# Patient Record
Sex: Female | Born: 1995 | Race: White | Hispanic: No | Marital: Single | Smoking: Never smoker
Health system: Southern US, Community
[De-identification: ages and names within clinical notes are randomized; demographics above are authoritative.]

## PROBLEM LIST (undated history)

## (undated) DIAGNOSIS — S93303A Unspecified subluxation of unspecified foot, initial encounter: Secondary | ICD-10-CM

## (undated) DIAGNOSIS — M6700 Short Achilles tendon (acquired), unspecified ankle: Secondary | ICD-10-CM

## (undated) DIAGNOSIS — Z8701 Personal history of pneumonia (recurrent): Secondary | ICD-10-CM

## (undated) DIAGNOSIS — Z973 Presence of spectacles and contact lenses: Secondary | ICD-10-CM

## (undated) DIAGNOSIS — L309 Dermatitis, unspecified: Secondary | ICD-10-CM

## (undated) DIAGNOSIS — J45909 Unspecified asthma, uncomplicated: Secondary | ICD-10-CM

## (undated) HISTORY — PX: TONSILLECTOMY AND ADENOIDECTOMY: SUR1326

---

## 2015-09-04 NOTE — H&P (Signed)
Visit Note - Office Visit   Provider: Sherlynn StallsJason Annalina Needles, DPM Encounter Date: Jul 27, 2015 Patient: Bianca Mccullough, Bianca Mccullough    ([id number]) Sex: Female       DOB: Apr 15, 1996      Age: 20 year 3 month 1 week       Race: White Address: [address] ,  [place]  [place]  [id number]    [place] Phone(H): [phone] Primary Dr.: MICHELLE POWELL Insurance(s):  [id number] (PP) Referred By:  Gustavus MessingMICHELLE POWELL  Complaint: Chief Complaint: follow up; from CT.  History of present illness: Nature: aching and sharp. Location: plantar heel, arch , bilaterally. Onset: (Acute vs Insidious): Gradual. Character/Pain Level: 7 on a 10-point scale. Aggravating factors: prolonged standing and work environment. Patient denies diabetes.  Trauma: No history of trauma.  Current Medication: 1. Albuterol Sulfate .083 % Nebu (Other MD)  2. Symbicort 80-4.5 Mcg Inhaler Mcg/actuation (Other MD)  3. Zyrtec (Other MD)   ROS: Integument:  (-) for itching; (-) for psoriasis; (+) for eczema; (-) for hives; (-) for rash; (-) for wounds; (-) for skin cancer. Musculoskeletal:  (-) for arthritis; (-) for stiffness; (-) for low back pain; (-) for bursitis; (-) for gout; (+) for knee pain; (+) for hip pain; (-) morning stiffness; (-) for multiple joint pains. Constitutional: No fever, chills, or nausea.  Medical History: Asthma.  tonsils and adnoids.  Family History/Social History: Arthritis: Grandmother. Cancer: History of cancer in several family members.  Patient denies being currently pregnant.  Smoking History: Patient denies tobacco use. Alcohol Use: Patient denies alcohol use. INSULIN: Patient denies taking insulin. COUMADIN/PLAVIX USE: Patient denies taking coumadin/plavix.  Allergy: Penicillins  Examination: Labs/Imaging Results: Normal CT without significant findings.  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  Established office visit to discuss etiology treatment and prognosis   Diagnosis: Achilles Contracture LEFT; Achilles Contracture RIGHT and Subluxation LEFT Foot; Subluxation RIGHT Foot  EVALUATION & MANAGEMENT: Discussion: Discussed surgical options versus conservative care along with nature of procedure and post op course.  Recommendations:   Prefab AFO. Custom molded orthotics. Surgical Consultation: Discussed surgical versus conservative treatment options. Risks versus benefits were discussed along with the nature of the procedure, post-operative course. Discussed possible complications, not limited to, such as: slow-healing, infection, need for further surgery, chronic pain (RSDS), blood clots, bleeding problems, weakness, chronic swelling of the foot/digits, and arthritis. Rare but serious complications can even result in loss of digit, loss of limb, or loss of life. No guarantees were given. Alternatives to surgery were discussed today.  PLANNED PROCEDURE(S). Left: Subtalar joint implant using conical screw to stabilize subluxation at the talocalcaneal joint. Gastrocnemius Recession. CPT [id number] and [id number].   We will begin planning for scheduling the surgery based on patient's preference.  SUPPLIES:   PNEUMATIC CAM WALKER: Prescribed PNEUMATIC CAM WALKER for the LEFT LOWER EXTREMITY. Indications: (Diagnosis: Subluxation LEFT Foot, reduce pressure at the surgical area by immobilizing flexion and extension at the ankle as well as reducing plantar pressure forces via the rocker bottom outsole) . Patient will make a decision today based on cost and/or precertification. I believe these are necessary based on patient's condition and should help in the long term management of symptoms .

## 2015-09-05 ENCOUNTER — Encounter (HOSPITAL_BASED_OUTPATIENT_CLINIC_OR_DEPARTMENT_OTHER): Payer: Self-pay | Admitting: *Deleted

## 2015-09-05 NOTE — Progress Notes (Addendum)
SPOKE W/ PT'S MOTHER.  NPO AFTER MN.  ARRIVE AT 0945.  NEEDS URINE PREG.  OTHER CURRENT LAB RESULTS IN CHART.  WILL TAKE ZYRTEC / DO SYMBICORT INHALER AM DOS W/ SIPS OF WATER AND BRING RESCUE INHALER.  MOTHER H&P AND CXR DONE 09-04-2015 AND FAXED TO DR Suzette BattiestZEIGLER .  [facility] D[facility]2015.  DUE TO DX OF PNEUMONIA AFTER THIS DATE, ON 08-21-2015,  CALLED AND LEFT MESSAGE FOR LESLIE, OR SCHEDULER FOR DR ZEIGLER, THAT WE NEED CXR RESULT FROM 09-04-2015 AND UPDATE OR ADDENDUM ADDED TO H&P.  RECEIVED CXR RESULT FROM 09-04-2015, PNEUMONIA RESOLVED.

## 2015-09-07 ENCOUNTER — Encounter (HOSPITAL_BASED_OUTPATIENT_CLINIC_OR_DEPARTMENT_OTHER): Payer: Self-pay | Admitting: *Deleted

## 2015-09-07 ENCOUNTER — Ambulatory Visit (HOSPITAL_BASED_OUTPATIENT_CLINIC_OR_DEPARTMENT_OTHER): Payer: 59 | Admitting: Anesthesiology

## 2015-09-07 ENCOUNTER — Encounter (HOSPITAL_BASED_OUTPATIENT_CLINIC_OR_DEPARTMENT_OTHER)
Admission: RE | Disposition: A | Discharge status: Home / Self Care | Payer: Self-pay | Source: Ambulatory Visit | Attending: Podiatry

## 2015-09-07 ENCOUNTER — Ambulatory Visit (HOSPITAL_BASED_OUTPATIENT_CLINIC_OR_DEPARTMENT_OTHER)
Admission: RE | Admit: 2015-09-07 | Discharge: 2015-09-07 | Disposition: A | Discharge status: Home / Self Care | Payer: 59 | Source: Ambulatory Visit | Attending: Podiatry | Admitting: Podiatry

## 2015-09-07 DIAGNOSIS — M24875 Other specific joint derangements left foot, not elsewhere classified: Secondary | ICD-10-CM | POA: Diagnosis not present

## 2015-09-07 DIAGNOSIS — M24874 Other specific joint derangements of right foot, not elsewhere classified: Secondary | ICD-10-CM | POA: Insufficient documentation

## 2015-09-07 DIAGNOSIS — M79672 Pain in left foot: Secondary | ICD-10-CM | POA: Diagnosis present

## 2015-09-07 DIAGNOSIS — M6701 Short Achilles tendon (acquired), right ankle: Secondary | ICD-10-CM | POA: Diagnosis not present

## 2015-09-07 DIAGNOSIS — J45909 Unspecified asthma, uncomplicated: Secondary | ICD-10-CM | POA: Diagnosis not present

## 2015-09-07 DIAGNOSIS — M6702 Short Achilles tendon (acquired), left ankle: Secondary | ICD-10-CM | POA: Diagnosis not present

## 2015-09-07 DIAGNOSIS — M357 Hypermobility syndrome: Secondary | ICD-10-CM

## 2015-09-07 HISTORY — DX: Personal history of pneumonia (recurrent): Z87.01

## 2015-09-07 HISTORY — PX: INSERTION OF CONICAL SUBTALAR IMPLANT (CSI): SHX6453

## 2015-09-07 HISTORY — DX: Dermatitis, unspecified: L30.9

## 2015-09-07 HISTORY — DX: Presence of spectacles and contact lenses: Z97.3

## 2015-09-07 HISTORY — DX: Unspecified asthma, uncomplicated: J45.909

## 2015-09-07 HISTORY — DX: Short Achilles tendon (acquired), unspecified ankle: M67.00

## 2015-09-07 HISTORY — DX: Unspecified subluxation of unspecified foot, initial encounter: S93.303A

## 2015-09-07 HISTORY — PX: GASTROC RECESSION EXTREMITY: SHX6262

## 2015-09-07 LAB — POCT PREGNANCY, URINE: PREG TEST UR: NEGATIVE

## 2015-09-07 SURGERY — RECESSION, TENDON, GASTROCNEMIUS
Anesthesia: General | Site: Foot | Laterality: Left

## 2015-09-07 MED ORDER — HYDROCODONE-ACETAMINOPHEN 5-325 MG PO TABS
ORAL_TABLET | ORAL | Status: AC
  Filled 2015-09-07: qty 1

## 2015-09-07 MED ORDER — ONDANSETRON HCL 4 MG/2ML IJ SOLN
INTRAMUSCULAR | Status: DC | PRN
  Administered 2015-09-07: 4 mg via INTRAVENOUS

## 2015-09-07 MED ORDER — LACTATED RINGERS IV SOLN
INTRAVENOUS | Status: DC
Start: 2015-09-07 — End: 2015-09-07
  Administered 2015-09-07: 11:00:00 via INTRAVENOUS
  Filled 2015-09-07: qty 1000

## 2015-09-07 MED ORDER — MIDAZOLAM HCL 5 MG/5ML IJ SOLN
INTRAMUSCULAR | Status: DC | PRN
  Administered 2015-09-07: 2 mg via INTRAVENOUS

## 2015-09-07 MED ORDER — FENTANYL CITRATE (PF) 100 MCG/2ML IJ SOLN
INTRAMUSCULAR | Status: DC | PRN
  Administered 2015-09-07 (×2): 50 ug via INTRAVENOUS

## 2015-09-07 MED ORDER — CIPROFLOXACIN IN D5W 400 MG/200ML IV SOLN
400.0000 mg | INTRAVENOUS | Status: DC
Start: 2015-09-07 — End: 2015-09-07
  Filled 2015-09-07: qty 200

## 2015-09-07 MED ORDER — FENTANYL CITRATE (PF) 100 MCG/2ML IJ SOLN
INTRAMUSCULAR | Status: AC
  Filled 2015-09-07: qty 2

## 2015-09-07 MED ORDER — PROPOFOL 10 MG/ML IV BOLUS
INTRAVENOUS | Status: DC | PRN
  Administered 2015-09-07: 200 mg via INTRAVENOUS

## 2015-09-07 MED ORDER — DEXAMETHASONE SODIUM PHOSPHATE 4 MG/ML IJ SOLN
INTRAMUSCULAR | Status: DC | PRN
  Administered 2015-09-07: 10 mg via INTRAVENOUS

## 2015-09-07 MED ORDER — ONDANSETRON HCL 4 MG/2ML IJ SOLN
INTRAMUSCULAR | Status: AC
  Filled 2015-09-07: qty 2

## 2015-09-07 MED ORDER — LIDOCAINE HCL (CARDIAC) 20 MG/ML IV SOLN
INTRAVENOUS | Status: AC
  Filled 2015-09-07: qty 5

## 2015-09-07 MED ORDER — SODIUM CHLORIDE 0.9 % IR SOLN
Status: DC | PRN
  Administered 2015-09-07: 500 mL

## 2015-09-07 MED ORDER — CIPROFLOXACIN IN D5W 400 MG/200ML IV SOLN
INTRAVENOUS | Status: DC | PRN
  Administered 2015-09-07: 400 mg via INTRAVENOUS

## 2015-09-07 MED ORDER — MIDAZOLAM HCL 2 MG/2ML IJ SOLN
INTRAMUSCULAR | Status: AC
  Filled 2015-09-07: qty 2

## 2015-09-07 MED ORDER — PROPOFOL 10 MG/ML IV BOLUS
INTRAVENOUS | Status: AC
  Filled 2015-09-07: qty 20

## 2015-09-07 MED ORDER — BUPIVACAINE-EPINEPHRINE 0.5% -1:200000 IJ SOLN
INTRAMUSCULAR | Status: DC | PRN
  Administered 2015-09-07: 17 mL

## 2015-09-07 MED ORDER — LIDOCAINE HCL (CARDIAC) 20 MG/ML IV SOLN
INTRAVENOUS | Status: DC | PRN
  Administered 2015-09-07: 100 mg via INTRAVENOUS

## 2015-09-07 MED ORDER — CIPROFLOXACIN IN D5W 400 MG/200ML IV SOLN
INTRAVENOUS | Status: AC
  Filled 2015-09-07: qty 200

## 2015-09-07 MED ORDER — PROMETHAZINE HCL 25 MG/ML IJ SOLN
6.2500 mg | INTRAMUSCULAR | Status: DC | PRN
  Filled 2015-09-07: qty 1

## 2015-09-07 MED ORDER — FENTANYL CITRATE (PF) 100 MCG/2ML IJ SOLN
25.0000 ug | INTRAMUSCULAR | Status: DC | PRN
  Administered 2015-09-07: 25 ug via INTRAVENOUS
  Filled 2015-09-07: qty 1

## 2015-09-07 MED ORDER — DEXAMETHASONE SODIUM PHOSPHATE 10 MG/ML IJ SOLN
INTRAMUSCULAR | Status: AC
  Filled 2015-09-07: qty 1

## 2015-09-07 MED ORDER — HYDROCODONE-ACETAMINOPHEN 5-325 MG PO TABS
1.0000 | ORAL_TABLET | ORAL | Status: DC | PRN
  Administered 2015-09-07: 1 via ORAL
  Filled 2015-09-07: qty 1

## 2015-09-07 SURGICAL SUPPLY — 50 items
BLADE AVERAGE 25MMX9MM (BLADE)
BLADE AVERAGE 25X9 (BLADE) IMPLANT
BLADE SURG 15 STRL LF DISP TIS (BLADE) ×1 IMPLANT
BLADE SURG 15 STRL SS (BLADE) ×2
BNDG COHESIVE 3X5 TAN STRL LF (GAUZE/BANDAGES/DRESSINGS) ×3 IMPLANT
BNDG CONFORM 3 STRL LF (GAUZE/BANDAGES/DRESSINGS) ×3 IMPLANT
BNDG ESMARK 4X9 LF (GAUZE/BANDAGES/DRESSINGS) ×3 IMPLANT
COVER BACK TABLE 60X90IN (DRAPES) ×3 IMPLANT
CUFF TOURNIQUET SINGLE 24IN (TOURNIQUET CUFF) ×3 IMPLANT
DRAPE EXTREMITY T 121X128X90 (DRAPE) ×3 IMPLANT
DRAPE LG THREE QUARTER DISP (DRAPES) ×3 IMPLANT
DRAPE OEC MINIVIEW 54X84 (DRAPES) ×3 IMPLANT
ELECT REM PT RETURN 9FT ADLT (ELECTROSURGICAL) ×3
ELECTRODE REM PT RTRN 9FT ADLT (ELECTROSURGICAL) ×1 IMPLANT
GAUZE XEROFORM 1X8 LF (GAUZE/BANDAGES/DRESSINGS) ×3 IMPLANT
GLOVE BIOGEL M 6.5 STRL (GLOVE) ×6 IMPLANT
GLOVE BIOGEL PI IND STRL 6.5 (GLOVE) ×2 IMPLANT
GLOVE BIOGEL PI IND STRL 7.5 (GLOVE) ×1 IMPLANT
GLOVE BIOGEL PI INDICATOR 6.5 (GLOVE) ×4
GLOVE BIOGEL PI INDICATOR 7.5 (GLOVE) ×2
GLOVE SURG SS PI 8.0 STRL IVOR (GLOVE) ×3 IMPLANT
GOWN STRL REUS W/ TWL LRG LVL3 (GOWN DISPOSABLE) ×2 IMPLANT
GOWN STRL REUS W/TWL LRG LVL3 (GOWN DISPOSABLE) ×4
GOWN STRL REUS W/TWL XL LVL3 (GOWN DISPOSABLE) ×3 IMPLANT
IMPLANT PRO STOP SUBTALAR 8X14 (Screw) ×3 IMPLANT
KIT ROOM TURNOVER WOR (KITS) ×3 IMPLANT
NEEDLE HYPO 25X1 1.5 SAFETY (NEEDLE) ×3 IMPLANT
NS IRRIG 500ML POUR BTL (IV SOLUTION) ×3 IMPLANT
PACK BASIN DAY SURGERY FS (CUSTOM PROCEDURE TRAY) ×3 IMPLANT
PADDING CAST ABS 3INX4YD NS (CAST SUPPLIES) ×2
PADDING CAST ABS 4INX4YD NS (CAST SUPPLIES) ×2
PADDING CAST ABS COTTON 3X4 (CAST SUPPLIES) ×1 IMPLANT
PADDING CAST ABS COTTON 4X4 ST (CAST SUPPLIES) ×1 IMPLANT
PENCIL BUTTON HOLSTER BLD 10FT (ELECTRODE) ×3 IMPLANT
SPONGE GAUZE 4X4 12PLY (GAUZE/BANDAGES/DRESSINGS) ×3 IMPLANT
SPONGE GAUZE 4X4 12PLY STER LF (GAUZE/BANDAGES/DRESSINGS) ×3 IMPLANT
STOCKINETTE 4X48 STRL (DRAPES) ×3 IMPLANT
SUCTION FRAZIER HANDLE 10FR (MISCELLANEOUS) ×2
SUCTION TUBE FRAZIER 10FR DISP (MISCELLANEOUS) ×1 IMPLANT
SUT ETHILON 4 0 PS 2 18 (SUTURE) ×3 IMPLANT
SUT MNCRL AB 4-0 PS2 18 (SUTURE) IMPLANT
SUT VIC AB 3-0 SH 27 (SUTURE)
SUT VIC AB 3-0 SH 27X BRD (SUTURE) IMPLANT
SUT VICRYL 4-0 PS2 18IN ABS (SUTURE) ×3 IMPLANT
SYR BULB 3OZ (MISCELLANEOUS) ×3 IMPLANT
SYR CONTROL 10ML LL (SYRINGE) ×3 IMPLANT
TOWEL OR 17X24 6PK STRL BLUE (TOWEL DISPOSABLE) ×6 IMPLANT
TUBE CONNECTING 12'X1/4 (SUCTIONS) ×1
TUBE CONNECTING 12X1/4 (SUCTIONS) ×2 IMPLANT
UNDERPAD 30X30 INCONTINENT (UNDERPADS AND DIAPERS) ×3 IMPLANT

## 2015-09-07 NOTE — Anesthesia Preprocedure Evaluation (Addendum)
Anesthesia Evaluation  Patient identified by MRN, date of birth, ID band Patient awake    Reviewed: Allergy & Precautions, NPO status , Patient's Chart, lab work & pertinent test results  Airway Mallampati: II  TM Distance: >3 FB Neck ROM: Full    Dental   Pulmonary asthma ,    breath sounds clear to auscultation       Cardiovascular negative cardio ROS   Rhythm:Regular Rate:Normal     Neuro/Psych    GI/Hepatic negative GI ROS, Neg liver ROS,   Endo/Other  negative endocrine ROS  Renal/GU negative Renal ROS     Musculoskeletal   Abdominal   Peds  Hematology   Anesthesia Other Findings   Reproductive/Obstetrics                             Anesthesia Physical Anesthesia Plan  ASA: III  Anesthesia Plan: General   Post-op Pain Management:    Induction: Intravenous  Airway Management Planned: LMA  Additional Equipment:   Intra-op Plan:   Post-operative Plan: Extubation in OR  Informed Consent: I have reviewed the patients History and Physical, chart, labs and discussed the procedure including the risks, benefits and alternatives for the proposed anesthesia with the patient or authorized representative who has indicated his/her understanding and acceptance.   Dental advisory given  Plan Discussed with: CRNA and Anesthesiologist  Anesthesia Plan Comments:         Anesthesia Quick Evaluation

## 2015-09-07 NOTE — H&P (Signed)
  Discussed procedure and reviewed H&P. She wishes to proceed with surgery today.

## 2015-09-07 NOTE — Anesthesia Postprocedure Evaluation (Signed)
Anesthesia Post Note  Patient: Bianca Mccullough  Procedure(s) Performed: Procedure(s) (LRB): GASTROCNEMIUS RECESSION LEFT (Left) SUBTALAR IMPLANT LEFT (Left)  Patient location during evaluation: PACU Anesthesia Type: General Level of consciousness: awake Pain management: pain level controlled Vital Signs Assessment: post-procedure vital signs reviewed and stable Respiratory status: spontaneous breathing Cardiovascular status: stable Anesthetic complications: no    Last Vitals:  Filed Vitals:   09/07/15 0954 09/07/15 1239  BP: 128/72 135/96  Pulse: 79 94  Temp: 36.7 C 36.8 C  Resp: 16 20    Last Pain:  Filed Vitals:   09/07/15 1254  PainSc: 5                  Bianca Mccullough

## 2015-09-07 NOTE — Op Note (Signed)
09/07/2015  12:36 PM  PATIENT:  Bianca Mccullough  20 y.o. female  PRE-OPERATIVE DIAGNOSIS:  SHORT ACHILLES TENDON ACQUIRED LEFT, OTHER SPECIFIC JOINT DERANGEMENTS LEFT FOOT NOT ELSWHERE CLASSIFIED  POST-OPERATIVE DIAGNOSIS:  SHORT ACHILLES TENDON ACQUIRED LEFT, OTHER SPECIFIC JOINT DERANGEMENTS LEFT FOOT NOT ELSWHERE CLASSIFIED  PROCEDURE:  Procedure(s): GASTROCNEMIUS RECESSION LEFT (Left) SUBTALAR IMPLANT LEFT (Left)  SURGEON:  Surgeon(s) and Role:    * Sherin QuarryJason C Kysa Calais, DPM - Primary  PHYSICIAN ASSISTANT:   ASSISTANTS: none   ANESTHESIA:   general  EBL:     BLOOD ADMINISTERED:none  DRAINS: none   LOCAL MEDICATIONS USED:  MARCAINE    and Amount: 19 ml  SPECIMEN:  No Specimen  DISPOSITION OF SPECIMEN:  N/A  COUNTS:  YES  TOURNIQUET:   Total Tourniquet Time Documented: Calf (Left) - 26 minutes Total: Calf (Left) - 26 minutes   DICTATION: .Reubin Milanragon Dictation  PLAN OF CARE: Discharge to home after PACU  PATIENT DISPOSITION:  PACU - hemodynamically stable.   Delay start of Pharmacological VTE agent (>24hrs) due to surgical blood loss or risk of bleeding: not applicable  SURGICAL INDICATIONS:  Patient is here for surgical intervention and surgery is further discussed today based on our in-office consultation.  All questions were answered and consent is signed and in the chart outlining risks versus benefits.  The surgical site is marked today and I reviewed the planned procedure(s) with the patient today in preoperative holding area.  Discussed lengthening the gastrocnemius muscle and implant device to control subtalar motion and she wishes to proceed.  PROCEDURES PERFORMED:  Patient is transported to the operating room and the surgical site is prepped and draped in the usual sterile manner.  She is placed in a supine position and calf tourniquet is applied and inflated to 250 mmHg.  Subtalar implant with Arthrex size 8 subtalar implant, gastrocnemius recession left  ankle: Linear incision is made at the medial aspect of the gastrocnemius muscle about 8 cm proximal to the Achilles tendon insertion. Dissected down through subcutaneous tissue with sharp and blunt dissection down to the peritenon which is incised exposing the tendinous portion of the gastrocnemius muscle. Separated this from the soleus muscle and then lengthened the tendon with 2 cuts proximally one cut distally connecting the arms in a T fashion to lengthen the tendon. There is good range of motion at the ankle therefore we flushed with sterile antibiotic solution and closed peritenon with 3-0 Vicryl and skin is closed with 3-0 Vicryl and 4-0 nylon. Subtalar implant, incision made over the sinus tarsi dissected down to subcutaneous tissue down to the canalis tarsi and the guidewire is inserted from lateral to medial. Size 7 and then size 8 trials were inserted 3 cm deep to the skin. Size 8 implant was then called for and inserted and fluoroscopy was used to confirm good alignment. Good correction was noted therefore we flushed with sterile antibiotic solution and closed with 3-0 Vicryl and 4-0 nylon suture. Xeroform gauze wet-to-dry dressing applied, tourniquet deflated. Patient is placed in a Jones compression cast with cast padding stockinette and Coban.  Patient returns to PACU having tolerated the procedure and anesthesia well.  Patient is given written and oral post op instructions.  Patient will follow up in my office as scheduled for a post op appointment.

## 2015-09-07 NOTE — Anesthesia Procedure Notes (Signed)
Procedure Name: LMA Insertion Date/Time: 09/07/2015 11:45 AM Performed by: Norva PavlovALLAWAY, Jaydis Duchene G Pre-anesthesia Checklist: Patient identified, Emergency Drugs available, Suction available and Patient being monitored Patient Re-evaluated:Patient Re-evaluated prior to inductionOxygen Delivery Method: Circle System Utilized Preoxygenation: Pre-oxygenation with 100% oxygen Intubation Type: IV induction Ventilation: Mask ventilation without difficulty LMA: LMA inserted LMA Size: 4.0 Number of attempts: 1 Airway Equipment and Method: bite block Placement Confirmation: positive ETCO2 Tube secured with: Tape Dental Injury: Teeth and Oropharynx as per pre-operative assessment

## 2015-09-07 NOTE — Transfer of Care (Signed)
Last Vitals:  Filed Vitals:   09/07/15 0954  BP: 128/72  Pulse: 79  Temp: 36.7 C  Resp: 16  Immediate Anesthesia Transfer of Care Note  Patient: Bianca Mccullough  Procedure(s) Performed: Procedure(s) (LRB): GASTROCNEMIUS RECESSION LEFT (Left) SUBTALAR IMPLANT LEFT (Left)  Patient Location: PACU  Anesthesia Type: General  Level of Consciousness: awake, alert  and oriented  Airway & Oxygen Therapy: Patient Spontanous Breathing and Patient connected to face mask oxygen  Post-op Assessment: Report given to PACU RN and Post -op Vital signs reviewed and stable  Post vital signs: Reviewed and stable  Complications: No apparent anesthesia complications

## 2015-09-07 NOTE — Discharge Instructions (Signed)
Podiatry Postoperative Discharge Instructions °Dr. Zeigler ° °1.  Day of Surgery: Please have your prescription (s) filled immediately upon leaving the surgery center if you have not already done so. Elevate both feet in the car on the way home. Please go directly to bed and keep your feet elevated by putting two pillows under your feet and one pillow under your knees. Keep your feet out from under the blanket. ° °2.  Discomfort and Swelling: Your foot may be numb for the remainder of the day. Swelling is expected. In some cases the skin of the foot or the leg may take on a bruised, black and blue appearance. ° °3.  Temperature: Take your temperature on the 2nd, 3rd, and 4th days after your surgery at 5pm. If your temperature is above 101 degrees please give your physician a call. ° °4.  Bleeding: A slight amount of drainage on the dressing is normal. Resting your foot in an elevated position will limit bleeding. If bleeding continues, wrap a towel around your foot and apply an ice pack. ° °5.  Dressing: Keep the bandage absolutely dry and do not remove the dressing. If the dressing becomes wet or soiled, call your physician's office immediately. ° °6.  Stitches: The stitches will remain in place for 2-3 weeks, depending on the nature of your operation. Slight pulling sensations may be felt due to the stitches. This is a normal occurrence. ° °7.  Ice: Apply a well-sealed ice pack to your foot for 30 minutes of every hour for the first 24 hours after your surgery. This means 30 minutes on and 30 minutes off, each hour while awake. Do not leave the ice pack in place at bedtime or during long naps. If the ice you are using becomes wet on the outside as the ice melts, place a washcloth between the ice pack and the dressing. ° °8.  Shoes: Wear your postoperative shoe or boot anytime you put weight on your feet, even if it is just to walk to the bathroom and back. Remove boot or surgical shoe while non-weightbearing for  gentle range of motion exercises at the knee and gas pedal motion at the ankle and wiggling toes several times per day unless otherwise instructed.  It will be at least 2-3 weeks before you can wear your regular shoes again. Please do not wear anything other than your postoperative shoe until told to do so by your physician. ° °9.  Diet: Return to your regular diet slowly within 24 hours. If you received sedation, your first meal should be light. Do not eat greasy or spicy foods for the first meal. Drink large quantities of liquids, especially water, citrus, and other fruit juices. Please call if unable to keep fluids down. ° °10. General Activities: Recovery is a gradual process; however, you should feel better with each passing day. On the first day, only leave the bed to go to the bathroom. Gradually increase your activity after the first day. For the first week or two, resting each day is important; strenuous work, heavy lifting, and excessive social activities should be avoided. ° ° °Podiatry Postoperative Discharge Instructions ( Page 2) ° °11. Post-operative Care: The post-operative care period lasts for approximately six weeks. During this time, periodic visits to your physician's office will be required so your healing process can be monitored closely. It is essential for your future health that you continue to be monitored by your physician until you are discharged and completely healed. Care   during this time is the most important part of your recovery process. ° °12. Recovery from Anesthesia: You may feel drowsy and your reflexes may be slowed for 24 hours. Do not drive, use machinery, appliances, or ride bicycles or scooters. Do not make important decisions. ° °13. Complications: Please call your physician following your surgery if you have any of the following complications: ° °    1. Severe pain unrelieved by medication  °    2. Excessive, heavy, or prolonged bleeding °    3. Dizziness or  fainting °    4. Soiled or wet dressings °    5. Temperature over 101.0 degrees ° ° °Dr. Zeigler, [facility] °[place] Office: [phone] °Answering Service: [phone] ° ° ° °Post Anesthesia Home Care Instructions ° °Activity: °Get plenty of rest for the remainder of the day. A responsible adult should stay with you for 24 hours following the procedure.  °For the next 24 hours, DO NOT: °-Drive a car °-Operate machinery °-Drink alcoholic beverages °-Take any medication unless instructed by your physician °-Make any legal decisions or sign important papers. ° °Meals: °Start with liquid foods such as gelatin or soup. Progress to regular foods as tolerated. Avoid greasy, spicy, heavy foods. If nausea and/or vomiting occur, drink only clear liquids until the nausea and/or vomiting subsides. Call your physician if vomiting continues. ° °Special Instructions/Symptoms: °Your throat may feel dry or sore from the anesthesia or the breathing tube placed in your throat during surgery. If this causes discomfort, gargle with warm salt water. The discomfort should disappear within 24 hours. ° °If you had a scopolamine patch placed behind your ear for the management of post- operative nausea and/or vomiting: ° °1. The medication in the patch is effective for 72 hours, after which it should be removed.  Wrap patch in a tissue and discard in the trash. Wash hands thoroughly with soap and water. °2. You may remove the patch earlier than 72 hours if you experience unpleasant side effects which may include dry mouth, dizziness or visual disturbances. °3. Avoid touching the patch. Wash your hands with soap and water after contact with the patch. °  ° °

## 2015-09-08 ENCOUNTER — Encounter (HOSPITAL_BASED_OUTPATIENT_CLINIC_OR_DEPARTMENT_OTHER): Payer: Self-pay | Admitting: Podiatry

## 2015-09-11 ENCOUNTER — Encounter (HOSPITAL_BASED_OUTPATIENT_CLINIC_OR_DEPARTMENT_OTHER): Payer: Self-pay | Admitting: Podiatry

## 2019-02-08 ENCOUNTER — Emergency Department (INDEPENDENT_AMBULATORY_CARE_PROVIDER_SITE_OTHER)
Admission: EM | Admit: 2019-02-08 | Discharge: 2019-02-08 | Disposition: A | Discharge status: Home / Self Care | Payer: 59 | Source: Home / Self Care

## 2019-02-08 ENCOUNTER — Other Ambulatory Visit: Payer: Self-pay

## 2019-02-08 DIAGNOSIS — Z111 Encounter for screening for respiratory tuberculosis: Secondary | ICD-10-CM

## 2019-02-08 NOTE — ED Triage Notes (Signed)
Pt here for PPD placement.  Read will be from 230 pm Wednesday til 230 pm Thursday.  Placed in right forearm between the legs of the elephant tattoo.  Pt tolerated procedure well and without complications.

## 2019-02-10 ENCOUNTER — Emergency Department
Admission: EM | Admit: 2019-02-10 | Discharge: 2019-02-10 | Discharge status: Absent without leave | Payer: 59 | Source: Home / Self Care

## 2019-02-10 ENCOUNTER — Emergency Department
Admission: EM | Admit: 2019-02-10 | Discharge: 2019-02-10 | Disposition: A | Discharge status: Home / Self Care | Payer: 59 | Source: Home / Self Care

## 2019-02-10 ENCOUNTER — Other Ambulatory Visit: Payer: Self-pay

## 2019-02-10 LAB — READ PPD: TB Skin Test: NEGATIVE

## 2019-02-10 NOTE — ED Triage Notes (Signed)
Patient presented today for follow up of PPD placed on      . Examination: O2 sat:100%      P:93       , right  forearm inspected and no signs of induration or redness. Provided test results to patient and counseled to monitor site for signs of swelling or redness. If s/s present return to the clinic or proceed to the ED for further evaluation. Charna Archer, LPN

## 2019-10-06 ENCOUNTER — Encounter: Payer: Self-pay | Admitting: Emergency Medicine

## 2019-10-06 ENCOUNTER — Emergency Department (INDEPENDENT_AMBULATORY_CARE_PROVIDER_SITE_OTHER)
Admission: EM | Admit: 2019-10-06 | Discharge: 2019-10-06 | Disposition: A | Discharge status: Home / Self Care | Payer: 59 | Source: Home / Self Care | Attending: Family Medicine | Admitting: Family Medicine

## 2019-10-06 ENCOUNTER — Other Ambulatory Visit: Payer: Self-pay

## 2019-10-06 DIAGNOSIS — L0102 Bockhart's impetigo: Secondary | ICD-10-CM

## 2019-10-06 MED ORDER — SULFAMETHOXAZOLE-TRIMETHOPRIM 800-160 MG PO TABS: ORAL_TABLET | ORAL | 0 refills | Status: DC

## 2019-10-06 NOTE — Discharge Instructions (Addendum)
Apply a warm compress 2 to 3 times daily: Place a towel between your skin and the heat source. Leave the heat on for 20-30 minutes. Remove the heat if your skin turns bright red. This is especially important if you are unable to feel pain, heat, or cold. You may have a greater risk of getting burned.

## 2019-10-06 NOTE — ED Provider Notes (Signed)
[facility]    CSN: [id number] Arrival date & time: 10/06/19  1436      History   Chief Complaint Chief Complaint  Patient presents with  . Wound Infection    pubic after waxing    HPI Bianca Mccullough is a 24 y.o. female.   Patient complains of an "ingrown hair" in her pubic area after "waxing" several weeks ago.  The lesion developed clear to yellow drainage about 10 days ago and has become more painful during the past two days.  She denies fevers, chills, and sweats.  The history is provided by the patient.  Abscess Abscess location: pubic area. Size:  1cm Abscess quality: painful, redness and weeping   Red streaking: no   Duration:  10 days Progression:  Worsening Pain details:    Quality:  Pressure   Severity:  Moderate   Duration:  10 days   Timing:  Constant   Progression:  Worsening Chronicity:  Recurrent Context: skin injury   Relieved by:  Nothing Worsened by:  Draining/squeezing Ineffective treatments:  Draining/squeezing Associated symptoms: no fatigue and no fever     Past Medical History:  Diagnosis Date  . Acquired contracture of Achilles tendon    bilateral  . Asthma dx age 49   last attack end of Dec 2016  . Eczema   . History of pneumonia    dx 08-21-2015 left lower lung by CXR pt had no asthma symptoms (tx'd one round antibiotic) Repeat CXR  was clear on 09-04-2015 ,PCP gave surgical clearance  . Subluxation of foot    bilateral  . Wears glasses     There are no problems to display for this patient.   Past Surgical History:  Procedure Laterality Date  . GASTROC RECESSION EXTREMITY Left 09/07/2015   Procedure: GASTROCNEMIUS RECESSION LEFT;  Surgeon: Jana Half, DPM;  Location: [facility];  Service: Podiatry;  Laterality: Left;  . INSERTION OF CONICAL SUBTALAR IMPLANT (CSI) Left 09/07/2015   Procedure: SUBTALAR IMPLANT LEFT;  Surgeon: Jana Half, DPM;  Location: [facility];  Service:  Podiatry;  Laterality: Left;  . TONSILLECTOMY AND ADENOIDECTOMY  age 67   and Bilateral Tympanostomy Tube Placement    OB History   No obstetric history on file.      Home Medications    Prior to Admission medications   Medication Sig Start Date End Date Taking? Authorizing Provider  albuterol (PROAIR HFA) 108 (90 Base) MCG/ACT inhaler Inhale 1 puff into the lungs every 6 (six) hours as needed for wheezing or shortness of breath.   Yes [provider]  albuterol (PROVENTIL) (2.5 MG/3ML) 0.083% nebulizer solution Take 2.5 mg by nebulization every 6 (six) hours as needed for wheezing or shortness of breath.   Yes [provider]  budesonide-formoterol (SYMBICORT) 160-4.5 MCG/ACT inhaler Inhale 2 puffs into the lungs 2 (two) times daily.   Yes [provider]  cetirizine (ZYRTEC) 10 MG tablet Take 10 mg by mouth every morning.   Yes [provider]  Norgestimate-Ethinyl Estradiol Triphasic (TRI-PREVIFEM) 0.18/0.215/0.25 MG-35 MCG tablet Take 1 tablet by mouth every evening.   Yes [provider]  QVAR REDIHALER 80 MCG/ACT inhaler SMARTSIG:2 Puff(s) By Mouth Twice Daily 05/01/19  Yes [provider]  sulfamethoxazole-trimethoprim (BACTRIM DS) 800-160 MG tablet Take one tab PO Q12hr 10/06/19   Kandra Nicolas, MD    Family History Family History  Problem Relation Age of Onset  . Healthy Mother   .  Healthy Father   . Healthy Half-Brother   . Healthy Half-Brother   . Healthy Half-Brother     Social History Social History   Tobacco Use  . Smoking status: Never Smoker  . Smokeless tobacco: Never Used  Substance Use Topics  . Alcohol use: No  . Drug use: No     Allergies   Ibuprofen and Penicillins   Review of Systems Review of Systems  Constitutional: Negative for chills, diaphoresis, fatigue and fever.  Skin: Positive for color change and wound.  All other systems reviewed and are negative.    Physical Exam Triage  Vital Signs ED Triage Vitals  Enc Vitals Group     BP 10/06/19 1456 110/70     Pulse Rate 10/06/19 1456 76     Resp 10/06/19 1456 15     Temp 10/06/19 1456 99.1 F (37.3 C)     Temp src --      SpO2 10/06/19 1456 100 %     Weight 10/06/19 1500 142 lb (64.4 kg)     Height 10/06/19 1500 4\' 11"  (1.499 m)     Head Circumference --      Peak Flow --      Pain Score 10/06/19 1457 2     Pain Loc --      Pain Edu? --      Excl. in GC? --    No data found.  Updated Vital Signs BP 110/70 (BP Location: Right Arm)   Pulse 76   Temp 99.1 F (37.3 C)   Resp 15   Ht 4\' 11"  (1.499 m)   Wt 64.4 kg   LMP 09/01/2019 (Exact Date)   SpO2 100%   BMI 28.68 kg/m   Visual Acuity Right Eye Distance:   Left Eye Distance:   Bilateral Distance:    Right Eye Near:   Left Eye Near:    Bilateral Near:     Physical Exam Vitals and nursing note reviewed.  Constitutional:      General: She is not in acute distress. HENT:     Head: Normocephalic.  Eyes:     Pupils: Pupils are equal, round, and reactive to light.  Cardiovascular:     Rate and Rhythm: Normal rate.  Pulmonary:     Effort: Pulmonary effort is normal.  Skin:    General: Skin is warm and dry.          Comments: Pubic area has an indurated central inflamed follicle, tender to palpation.  There are several surrounding smaller inflamed follicles.  Neurological:     Mental Status: She is alert.      UC Treatments / Results  Labs (all labs ordered are listed, but only abnormal results are displayed) Labs Reviewed - No data to display  EKG   Radiology No results found.  Procedures Procedures (including critical care time)  Medications Ordered in UC Medications - No data to display  Initial Impression / Assessment and Plan / UC Course  I have reviewed the triage vital signs and the nursing notes.  Pertinent labs & imaging results that were available during my care of the patient were reviewed by me and considered  in my medical decision making (see chart for details).    Begin Bactrim DS BID. Followup with Family Doctor if not improved in one week.    Final Clinical Impressions(s) / UC Diagnoses   Final diagnoses:  Pustular folliculitis     Discharge Instructions     Apply  a warm compress 2 to 3 times daily: Place a towel between your skin and the heat source. ? Leave the heat on for 20-30 minutes. ? Remove the heat if your skin turns bright red. This is especially important if you are unable to feel pain, heat, or cold. You may have a greater risk of getting burned.   ED Prescriptions    Medication Sig Dispense Auth. Provider   sulfamethoxazole-trimethoprim (BACTRIM DS) 800-160 MG tablet Take one tab PO Q12hr 20 tablet Lattie Haw, MD        Lattie Haw, MD 10/06/19 [phone]

## 2019-10-06 NOTE — ED Triage Notes (Signed)
Ingrown hair after waxing pubic area- started w/ an abscess approx 10 days ago Yellow to clear drainage Size of a dime per pt Warm compresses, tub bath w/ salt, draining at home- serum given to her by waxing shop No relief or decrease in size  No fever

## 2020-09-22 ENCOUNTER — Emergency Department (INDEPENDENT_AMBULATORY_CARE_PROVIDER_SITE_OTHER)
Admission: EM | Admit: 2020-09-22 | Discharge: 2020-09-22 | Disposition: A | Discharge status: Home / Self Care | Payer: 59 | Source: Home / Self Care | Attending: Emergency Medicine | Admitting: Emergency Medicine

## 2020-09-22 ENCOUNTER — Encounter: Payer: Self-pay | Admitting: Emergency Medicine

## 2020-09-22 ENCOUNTER — Other Ambulatory Visit: Payer: Self-pay

## 2020-09-22 ENCOUNTER — Emergency Department (INDEPENDENT_AMBULATORY_CARE_PROVIDER_SITE_OTHER): Payer: 59

## 2020-09-22 DIAGNOSIS — J4541 Moderate persistent asthma with (acute) exacerbation: Secondary | ICD-10-CM | POA: Diagnosis not present

## 2020-09-22 DIAGNOSIS — R0989 Other specified symptoms and signs involving the circulatory and respiratory systems: Secondary | ICD-10-CM | POA: Diagnosis not present

## 2020-09-22 DIAGNOSIS — R058 Other specified cough: Secondary | ICD-10-CM

## 2020-09-22 MED ORDER — DEXAMETHASONE 4 MG PO TABS: ORAL_TABLET | ORAL | 0 refills | Status: AC

## 2020-09-22 MED ORDER — AEROCHAMBER PLUS MISC: 2 refills | Status: AC

## 2020-09-22 MED ORDER — ALBUTEROL SULFATE HFA 108 (90 BASE) MCG/ACT IN AERS: 1.0000 | INHALATION_SPRAY | Freq: Four times a day (QID) | RESPIRATORY_TRACT | 0 refills | Status: DC | PRN

## 2020-09-22 MED ORDER — ALBUTEROL SULFATE HFA 108 (90 BASE) MCG/ACT IN AERS: 2.0000 | INHALATION_SPRAY | RESPIRATORY_TRACT | 0 refills | Status: AC | PRN

## 2020-09-22 NOTE — Discharge Instructions (Addendum)
2 puffs from your albuterol inhaler every 4 hours for 2 days, then every 6 hours for 2 days, then as needed.  Sure you use your spacer with your albuterol inhaler.  You can also use it with your Symbicort.  Dexamethasone will be easiest on your stomach.  This is equivalent to 5 days of prednisone.  Continue Flonase, start saline nasal irrigation with a Lloyd Huger Med rinse and distilled water as often as you want.  Monitor your peak flows twice a day.  Do it standing up and do them 3 times.  Keep a log of this.  If the numbers go down, go to the emergency department.

## 2020-09-22 NOTE — ED Provider Notes (Signed)
HPI  SUBJECTIVE:  Bianca Mccullough is a 25 y.o. female who presents with constant, daily upper chest heaviness, soreness, wheezing, chest congestion for 4 weeks.  She reports a cough that is productive in the early morning and then becomes dry.  She was evaluated by another provider, prescribed a Z-Pak about 2-1/2 weeks ago for sinusitis.  She states that it cleared up her nasal congestion and her chest symptoms, but her chest symptoms did not completely resolve.  They returned 2 weeks ago.  She denies fevers, shortness of breath, dyspnea on exertion, nasal congestion, sinus pain or pressure, postnasal drip, facial swelling, upper dental pain.  Has been taking DayQuil, NyQuil, Flonase, Zyrtec and Allegra and has been using her albuterol inhaler every 4 hours.  The albuterol helps temporarily.  Symptoms worse when she goes outside.  She has a past medical history of asthma .last admission was last year around this time of year.  No intubations, recent steroid use.  She has a past medical history of severe allergies and is status post gastric bypass.  No history of diabetes.  LMP: Now.  Denies the possibility being pregnant.  PMD: None   Past Medical History:  Diagnosis Date  . Acquired contracture of Achilles tendon    bilateral  . Asthma dx age 66   last attack end of Dec 2016  . Eczema   . History of pneumonia    dx 08-21-2015 left lower lung by CXR pt had no asthma symptoms (tx'd one round antibiotic) Repeat CXR  was clear on 09-04-2015 ,PCP gave surgical clearance  . Subluxation of foot    bilateral  . Wears glasses     Past Surgical History:  Procedure Laterality Date  . GASTROC RECESSION EXTREMITY Left 09/07/2015   Procedure: GASTROCNEMIUS RECESSION LEFT;  Surgeon: Sherin Quarry, DPM;  Location: [facility];  Service: Podiatry;  Laterality: Left;  . INSERTION OF CONICAL SUBTALAR IMPLANT (CSI) Left 09/07/2015   Procedure: SUBTALAR IMPLANT LEFT;  Surgeon: Sherin Quarry,  DPM;  [facility];  Service: Podiatry;  Laterality: Left;  . TONSILLECTOMY AND ADENOIDECTOMY  age 46   and Bilateral Tympanostomy Tube Placement    Family History  Problem Relation Age of Onset  . Healthy Mother   . Healthy Father   . Healthy Half-Brother   . Healthy Half-Brother   . Healthy Half-Brother     Social History   Tobacco Use  . Smoking status: Never Smoker  . Smokeless tobacco: Never Used  Vaping Use  . Vaping Use: Never used  Substance Use Topics  . Alcohol use: No  . Drug use: No    No current facility-administered medications for this encounter.  Current Outpatient Medications:  .  albuterol (PROVENTIL) (2.5 MG/3ML) 0.083% nebulizer solution, Take 2.5 mg by nebulization every 6 (six) hours as needed for wheezing or shortness of breath., Disp: , Rfl:  .  budesonide-formoterol (SYMBICORT) 160-4.5 MCG/ACT inhaler, Inhale 2 puffs into the lungs 2 (two) times daily., Disp: , Rfl:  .  cetirizine (ZYRTEC) 10 MG tablet, Take 10 mg by mouth every morning., Disp: , Rfl:  .  dexamethasone (DECADRON) 4 MG tablet, 4 tablets (16 mg) po at once on day 1 and 4 tabs (16 mg) po at once on day 2, Disp: 8 tablet, Rfl: 0 .  levonorgestrel-ethinyl estradiol (SEASONALE) 0.15-0.03 MG tablet, Take 1 tablet by mouth daily., Disp: , Rfl:  .  Norgestimate-Ethinyl Estradiol Triphasic 0.18/0.215/0.25 MG-35 MCG tablet,  Take 1 tablet by mouth every evening., Disp: , Rfl:  .  Spacer/Aero-Holding Chambers (AEROCHAMBER PLUS) inhaler, Use with inhaler, Disp: 1 each, Rfl: 2 .  albuterol (VENTOLIN HFA) 108 (90 Base) MCG/ACT inhaler, Inhale 2 puffs into the lungs every 4 (four) hours as needed for wheezing or shortness of breath., Disp: 1 each, Rfl: 0 .  QVAR REDIHALER 80 MCG/ACT inhaler, SMARTSIG:2 Puff(s) By Mouth Twice Daily, Disp: , Rfl:   Allergies  Allergen Reactions  . Ibuprofen Swelling    Pt states can take low doses of ibuprofen 200mg  without any problems Pt  states can take low doses of ibuprofen 200mg  without any problems   . Penicillins Anaphylaxis, Hives and Swelling     ROS  As noted in HPI.   Physical Exam  BP (!) 145/88 (BP Location: Left Arm)   Pulse 81   Temp 98.8 F (37.1 C) (Oral)   LMP 09/20/2020   SpO2 97%   Constitutional: Well developed, well nourished, no acute distress Eyes:  EOMI, conjunctiva normal bilaterally HENT: Normocephalic, atraumatic,mucus membranes moist.  No nasal congestion.  No maxillary, frontal sinus tenderness.  No postnasal drip. Respiratory: Normal inspiratory effort, fair air movement, diffuse wheezing.  Positive anterior and lateral chest wall tenderness Cardiovascular: Normal rate, regular rhythm, no murmurs rubs or gallops GI: nondistended skin: No rash, skin intact Musculoskeletal: no deformities Neurologic: Alert & oriented x 3, no focal neuro deficits Psychiatric: Speech and behavior appropriate   ED Course   Medications - No data to display  Orders Placed This Encounter  Procedures  . DG Chest 2 View    Standing Status:   Standing    Number of Occurrences:   1    Order Specific Question:   Reason for Exam (SYMPTOM  OR DIAGNOSIS REQUIRED)    Answer:   history of asthma    Order Specific Question:   Is patient pregnant?    Answer:   No    No results found for this or any previous visit (from the past 24 hour(s)). DG Chest 2 View  Result Date: 09/22/2020 CLINICAL DATA:  Chest congestion and morning cough. EXAM: CHEST - 2 VIEW COMPARISON:  None. FINDINGS: The heart size and mediastinal contours are within normal limits. Both lungs are clear. The visualized skeletal structures are unremarkable. IMPRESSION: No active cardiopulmonary disease. Electronically Signed   By: 09/22/2020 M.D.   On: 09/22/2020 17:40    ED Clinical Impression  1. Moderate persistent asthma with acute exacerbation      ED Assessment/Plan  Estimated peak flow 354 Actual peak flow:  240/253/300  Reviewed imaging independently.  No pneumonia.  Normal chest x-ray. see radiology report for full details.  Patient with a moderate asthma exacerbation.  Will send home with regularly scheduled albuterol with a spacer for the next 4 days, dexamethasone 16 mg x 2 doses.  Saline nasal irrigation, continue Flonase.  Patient was given a peak flow meter which shown how to use it.  She will monitor peak flows twice a day at home.  She will back off on the albuterol as her numbers go up, and if her numbers go down, she will go to the emergency department.  Will provide primary care list for ongoing care and order assistance in finding a PMD.  Discussed  imaging, MDM, treatment plan, and plan for follow-up with patient. Discussed sn/sx that should prompt return to the ED. patient agrees with plan.   Meds ordered this encounter  Medications  .  DISCONTD: albuterol (VENTOLIN HFA) 108 (90 Base) MCG/ACT inhaler    Sig: Inhale 1-2 puffs into the lungs every 6 (six) hours as needed for wheezing or shortness of breath.    Dispense:  1 each    Refill:  0  . dexamethasone (DECADRON) 4 MG tablet    Sig: 4 tablets (16 mg) po at once on day 1 and 4 tabs (16 mg) po at once on day 2    Dispense:  8 tablet    Refill:  0  . Spacer/Aero-Holding Chambers (AEROCHAMBER PLUS) inhaler    Sig: Use with inhaler    Dispense:  1 each    Refill:  2    Please educate patient on use  . albuterol (VENTOLIN HFA) 108 (90 Base) MCG/ACT inhaler    Sig: Inhale 2 puffs into the lungs every 4 (four) hours as needed for wheezing or shortness of breath.    Dispense:  1 each    Refill:  0      *This clinic note was created using Scientist, clinical (histocompatibility and immunogenetics). Therefore, there may be occasional mistakes despite careful proofreading.  ?    Domenick Gong, MD 09/22/20 [phone]

## 2020-09-22 NOTE — ED Triage Notes (Signed)
Patient c/o chest congestion, productive cough in the am, history of asthma.  Patient just finished a Z-Pack about 2 weeks ago for a sinus infection.  Patient has had negative COVID test (PCR).  Patient has been taking Allegra, Zyrtec, Dayquil and Nyquil.  Patient is vaccinated.

## 2020-09-23 ENCOUNTER — Ambulatory Visit: Payer: Self-pay

## 2023-01-19 IMAGING — DX DG CHEST 2V
2 series · 2 of 2 positions shown · non-contrast
Comparison: None.

CLINICAL DATA: Chest congestion and morning cough.

EXAM:
CHEST - 2 VIEW

[chest pa]
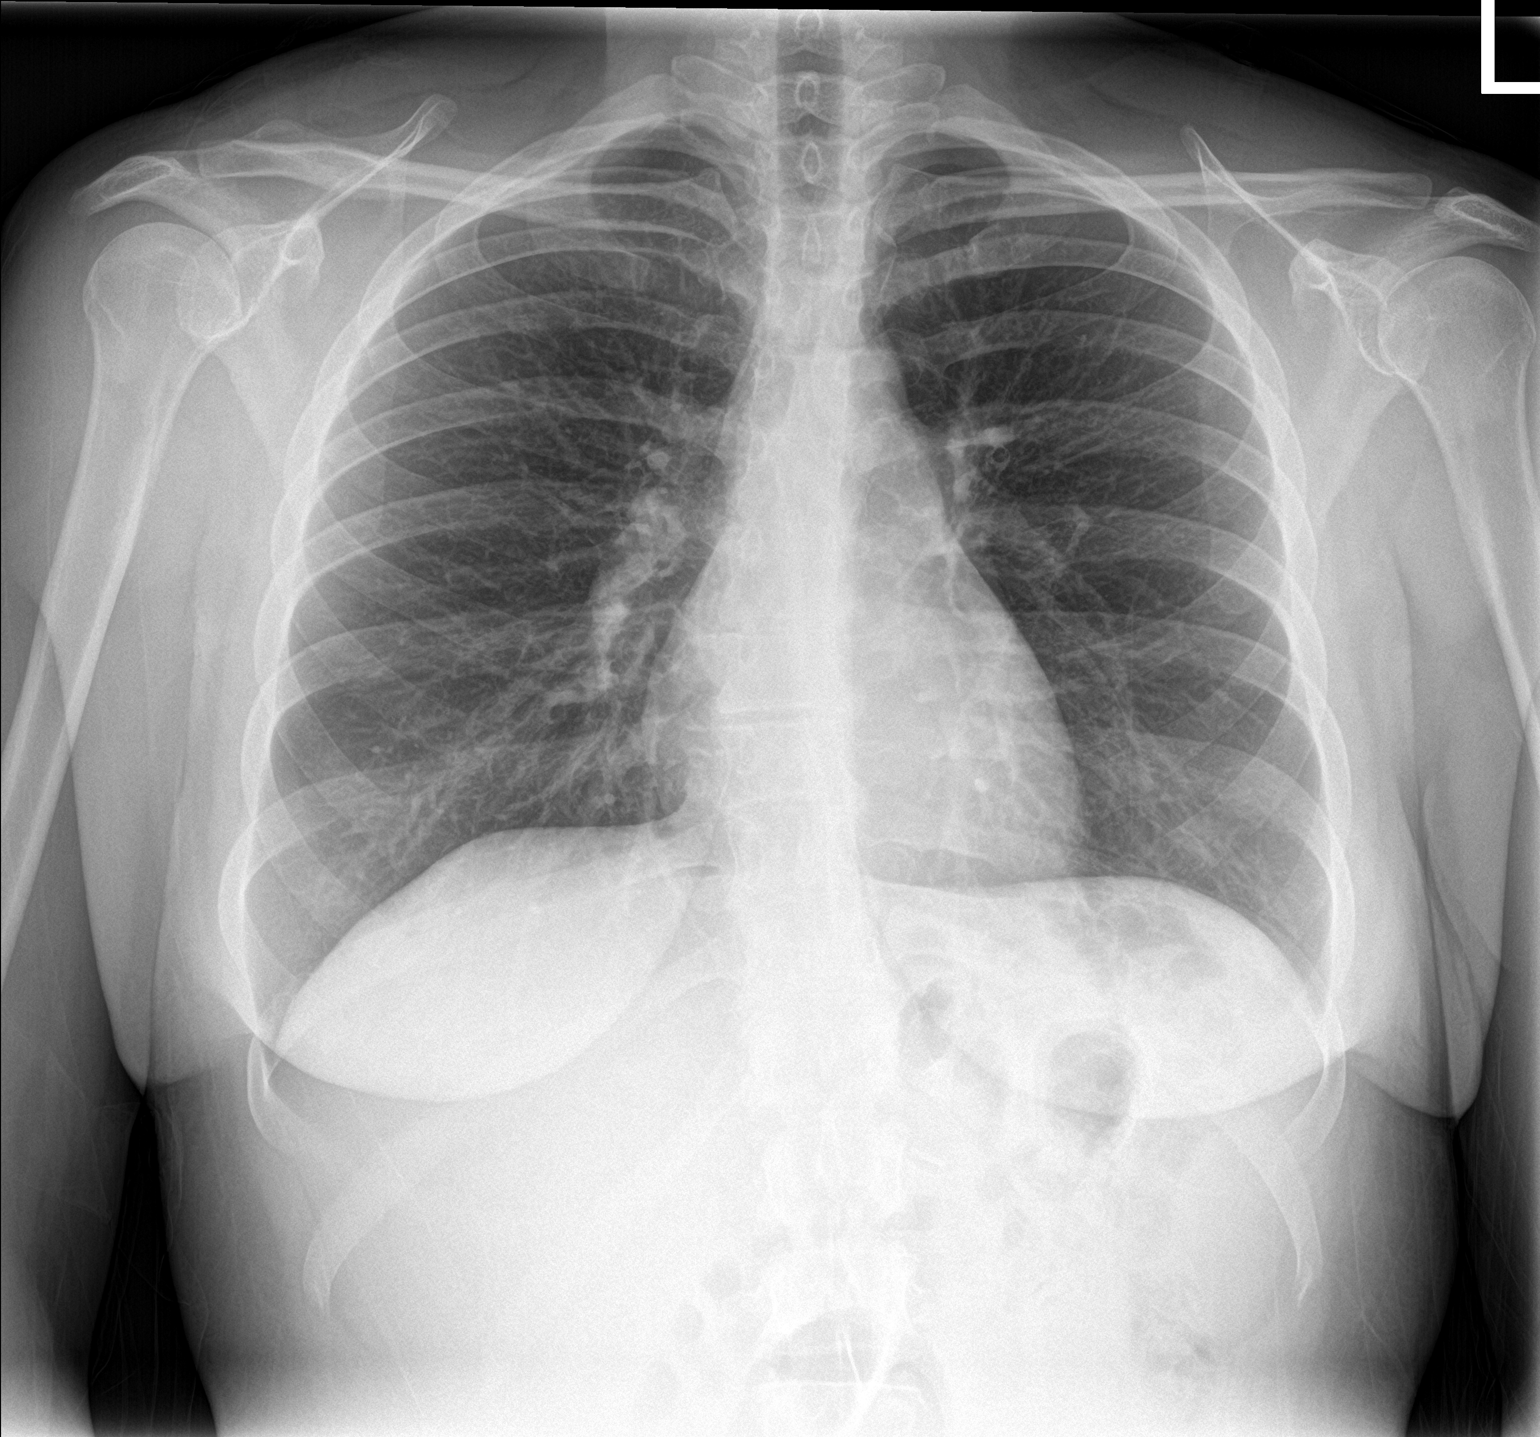

[chest lat]
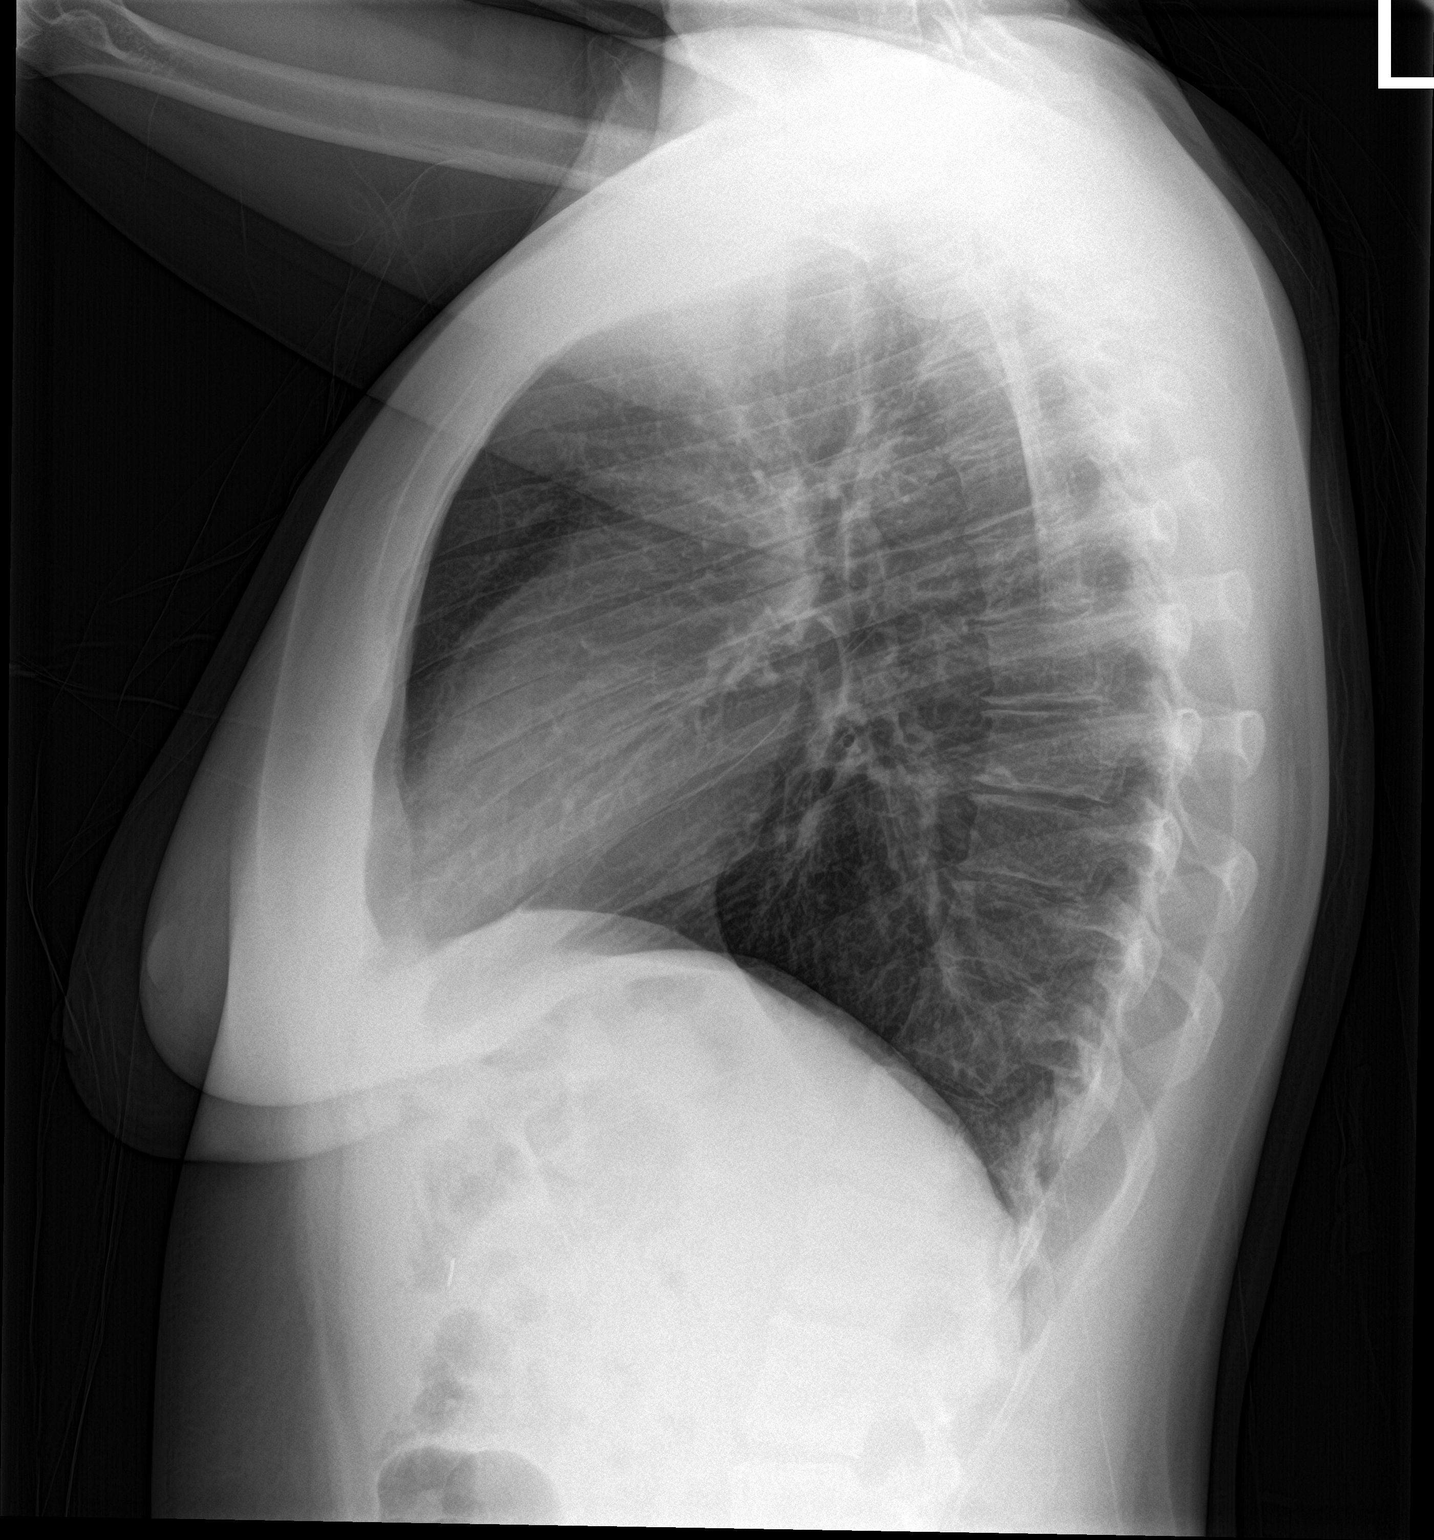

[2 of 2 positions shown; findings below may reference images not displayed]

FINDINGS: The heart size and mediastinal contours are within normal limits.
Both lungs are clear. The visualized skeletal structures are
unremarkable.
IMPRESSION: No active cardiopulmonary disease.
